# Patient Record
Sex: Male | Born: 1955 | Race: White | Hispanic: No | Marital: Single | Smoking: Former smoker
Health system: Southern US, Community
[De-identification: ages and names within clinical notes are randomized; demographics above are authoritative.]

## PROBLEM LIST (undated history)

## (undated) DIAGNOSIS — R079 Chest pain, unspecified: Secondary | ICD-10-CM

## (undated) HISTORY — DX: Chest pain, unspecified: R07.9

## (undated) HISTORY — PX: TONSILLECTOMY: SUR1361

---

## 1982-10-22 HISTORY — PX: APPENDECTOMY: SHX54

## 2003-09-30 ENCOUNTER — Emergency Department (HOSPITAL_COMMUNITY): Admission: EM | Admit: 2003-09-30 | Discharge: 2003-09-30 | Payer: Self-pay | Admitting: *Deleted

## 2003-11-30 ENCOUNTER — Encounter: Admission: RE | Admit: 2003-11-30 | Discharge: 2003-11-30 | Payer: Self-pay | Admitting: General Surgery

## 2004-03-20 ENCOUNTER — Encounter: Admission: RE | Admit: 2004-03-20 | Discharge: 2004-03-20 | Payer: Self-pay | Admitting: General Surgery

## 2004-04-14 ENCOUNTER — Ambulatory Visit (HOSPITAL_COMMUNITY): Admission: RE | Admit: 2004-04-14 | Discharge: 2004-04-14 | Payer: Self-pay | Admitting: Urology

## 2010-11-12 ENCOUNTER — Encounter: Payer: Self-pay | Admitting: Oncology

## 2012-02-13 ENCOUNTER — Ambulatory Visit: Payer: 59

## 2012-02-13 ENCOUNTER — Ambulatory Visit (INDEPENDENT_AMBULATORY_CARE_PROVIDER_SITE_OTHER): Payer: 59 | Admitting: Family Medicine

## 2012-02-13 VITALS — BP 189/102 | HR 64 | Temp 97.8°F | Resp 16 | Ht 69.0 in | Wt 222.0 lb

## 2012-02-13 DIAGNOSIS — M25469 Effusion, unspecified knee: Secondary | ICD-10-CM

## 2012-02-13 DIAGNOSIS — I10 Essential (primary) hypertension: Secondary | ICD-10-CM

## 2012-02-13 LAB — BASIC METABOLIC PANEL
BUN: 17 mg/dL (ref 6–23)
CO2: 25 mEq/L (ref 19–32)
Calcium: 9.4 mg/dL (ref 8.4–10.5)
Chloride: 109 mEq/L (ref 96–112)
Creat: 1.05 mg/dL (ref 0.50–1.35)
Glucose, Bld: 87 mg/dL (ref 70–99)
Potassium: 4.8 mEq/L (ref 3.5–5.3)
Sodium: 142 mEq/L (ref 135–145)

## 2012-02-13 NOTE — Progress Notes (Signed)
  Patient Name: Samuel Clark Date of Birth: [birth year] Medical Record Number: [id number] Gender: male Date of Encounter: 02/13/2012  History of Present Illness:  Samuel Clark is a 56 y.o. very pleasant male patient who presents with the following:  Here with right knee swelling off and on for about 2 weeks.  No known injury, never had this before.  No clicking, popping or getting stuck, no instability.  The knee hurts and is stiff sometimes but not very severely.  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  He has not had his BP checked in some time Unsure if any FHX of HTN.  He has no HA, CP or other symptoms  There is no problem list on file for this patient.  No past medical history on file. No past surgical history on file. History  Substance Use Topics  . Smoking status: Current Everyday Smoker  . Smokeless tobacco: Not on file  . Alcohol Use: Not on file   No family history on file. No Known Allergies  Medication list has been reviewed and updated.  Review of Systems: As per HPI- otherwise negative.   Physical Examination: Filed Vitals:   02/13/12 1035  BP: 186/92  Pulse: 64  Temp: 97.8 F (36.6 C)  TempSrc: Oral  Resp: 16  Height: 5\' 9"  (1.753 m)  Weight: 222 lb (100.699 kg)    Body mass index is 32.78 kg/(m^2).  GEN: WDWN, NAD, Non-toxic, A & O x 3, obese HEENT: Atraumatic, Normocephalic. Neck supple. No masses, No LAD. Ears and Nose: No external deformity. CV: RRR, No M/G/R. No JVD. No thrill. No extra heart sounds. PULM: CTA B, no wheezes, crackles, rhonchi. No retractions. No resp. distress. No accessory muscle use EXTR: No c/c/e NEURO Normal gait.  PSYCH: Normally interactive. Conversant. Not depressed or anxious appearing.  Calm demeanor.  Right knee:  Tiny joint effusion, no redness or heat, no tenderness with flexion or extension, stable ligaments.   [facility] reading  (PRIMARY) by  Dr. Patsy Lager.  patellofemoral compartment loss.   RIGHT KNEE - COMPLETE 4+ VIEW  Comparison: None.  Findings: Mild patellofemoral joint degenerative changes with slightly high riding patella.  Tiny joint effusion.  Minimal medial tibiofemoral joint space degenerative changes.  IMPRESSION: Mild patellofemoral joint degenerative changes with slightly high riding patella.  Tiny joint effusion.  Minimal medial tibiofemoral joint space degenerative changes.  Assessment and Plan: 1. Swelling of knee joint  DG Knee Complete 4 Views Right  2. Hypertension  Basic metabolic panel   Suspect that [name] does indeed have hypertension.  Check labs as above in anticipation of starting HTN medications.  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 Suspect he had OA of his knee, possibly a small meniscal tear.  Recommend DC the NSAIDs that he has been using and use tylenol as needed due to his HTN. At this time he is not in a hurry to see ortho, but will consider doing this if he is not better soon.  Ace wrap for compression may help as well.

## 2012-02-14 ENCOUNTER — Encounter: Payer: Self-pay | Admitting: Family Medicine

## 2012-02-18 ENCOUNTER — Telehealth: Payer: Self-pay | Admitting: Family Medicine

## 2012-02-18 NOTE — Telephone Encounter (Signed)
Please see copy of BP readings that patient dropped off on 02/18/12 @ 6:43pm (readings are in an inner-office envelope, in your box)

## 2012-02-19 NOTE — Telephone Encounter (Signed)
Please advise on these.

## 2012-02-20 ENCOUNTER — Other Ambulatory Visit: Payer: Self-pay | Admitting: Family Medicine

## 2012-02-20 DIAGNOSIS — I10 Essential (primary) hypertension: Secondary | ICD-10-CM

## 2012-02-20 MED ORDER — LISINOPRIL-HYDROCHLOROTHIAZIDE 10-12.5 MG PO TABS: 1.0000 | ORAL_TABLET | Freq: Every day | ORAL | Status: DC

## 2016-04-30 ENCOUNTER — Telehealth: Payer: Self-pay | Admitting: Internal Medicine

## 2016-04-30 NOTE — Telephone Encounter (Signed)
Received records from [facility] for appointment on 05/31/16 with Dr Rennis GoldenHilty.  [facility] for Dr Blanchie DessertHilty's schedule on 05/31/16. lp

## 2016-05-30 ENCOUNTER — Encounter: Payer: Self-pay | Admitting: Internal Medicine

## 2016-05-30 DIAGNOSIS — R079 Chest pain, unspecified: Secondary | ICD-10-CM | POA: Insufficient documentation

## 2016-05-31 ENCOUNTER — Encounter: Payer: Self-pay | Admitting: Internal Medicine

## 2016-05-31 ENCOUNTER — Encounter (INDEPENDENT_AMBULATORY_CARE_PROVIDER_SITE_OTHER): Payer: Self-pay

## 2016-05-31 ENCOUNTER — Ambulatory Visit (INDEPENDENT_AMBULATORY_CARE_PROVIDER_SITE_OTHER): Payer: 59 | Admitting: Internal Medicine

## 2016-05-31 VITALS — BP 142/90 | HR 71 | Ht 69.0 in | Wt 232.6 lb

## 2016-05-31 DIAGNOSIS — E668 Other obesity: Secondary | ICD-10-CM | POA: Diagnosis not present

## 2016-05-31 DIAGNOSIS — Z87891 Personal history of nicotine dependence: Secondary | ICD-10-CM | POA: Diagnosis not present

## 2016-05-31 DIAGNOSIS — I491 Atrial premature depolarization: Secondary | ICD-10-CM | POA: Diagnosis not present

## 2016-05-31 DIAGNOSIS — R079 Chest pain, unspecified: Secondary | ICD-10-CM

## 2016-05-31 NOTE — Patient Instructions (Signed)
Your physician has requested that you have en exercise stress myoview. For further information please visit [email or web address]. Please follow instruction sheet, as given.  Your physician recommends that you schedule a follow-up appointment as needed with Dr. Rennis GoldenHilty

## 2016-05-31 NOTE — Progress Notes (Signed)
OFFICE NOTE  Chief Complaint:  Chest pain  Primary Care Physician: Lupe Carney, MD  HPI:  Samuel Clark is a 60 y.o. male who has little past medical history. He had not regularly seen a physician until recently after had an episode of chest pain. It seems like he was mowing his lawn probably in late June or early July and had discomfort in his chest. It was a very hot day. He had significant diaphoresis and was not drinking a lot of water. The tightness in his chest improved after he rested however started mowing his lawn again in that came back. Subsequently he stopped doing some lawnmowing and then saw Dr. Clovis Riley. EKG showed no acute changes. He underwent lab work which was unremarkable and cholesterol profile indicated total cholesterol of 197, triglycerides 103 HDL 57 and LDL 120. No evidence of diabetes although there was impaired fasting glucose with a blood sugar of 112. Is not clear if this was a fasting sample. Subsequently he was referred for cardiac evaluation. He has no significant history of coronary disease in his parents or his sister. He does have a child a congenital heart disease and aortic valve replacement at age 40. He is a former smoker about 1 pack per week for 20 years but quit in 2012. He drinks about 10 beers a week, mostly on weekends. 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  PMHx:  Past Medical History:  Diagnosis Date  . Chest pain     Past Surgical History:  Procedure Laterality Date  . APPENDECTOMY  1984  . TONSILLECTOMY      FAMHx:  Family History  Problem Relation Age of Onset  . Hypertension Mother   . Cancer Father   . Healthy Sister     SOCHx:   reports that he quit smoking about 5 years ago. He does not have any smokeless tobacco history on file. He reports that he drinks about 3.0 oz of alcohol per week . His drug history is not on file.  ALLERGIES:  No Known Allergies  ROS: Pertinent items noted  in HPI and remainder of comprehensive ROS otherwise negative.  HOME MEDS: No current outpatient prescriptions on file.   No current facility-administered medications for this visit.     LABS/IMAGING: No results found for this or any previous visit (from the past 48 hour(s)). No results found.  WEIGHTS: Wt Readings from Last 3 Encounters:  05/31/16 232 lb 9.6 oz (105.5 kg)  02/13/12 222 lb (100.7 kg)    VITALS: BP (!) 142/90   Pulse 71   Ht  (1.753 m)   Wt 232 lb 9.6 oz (105.5 kg)   BMI 34.35 kg/m   EXAM: General appearance: alert, no distress and moderately obese Neck: no carotid bruit and no JVD Lungs: clear to auscultation bilaterally Heart: regular rate and rhythm and No murmur, occasional extra beats Abdomen: soft, non-tender; bowel sounds normal; no masses,  no organomegaly Extremities: extremities normal, atraumatic, no cyanosis or edema Pulses: 2+ and symmetric Skin: Skin color, texture, turgor normal. No rashes or lesions Neurologic: Grossly normal Psych: Pleasant  EKG: Normal sinus rhythm at 71 with PACs  ASSESSMENT: 1. Exertional chest pain 2. Former smoker 3. Moderate obesity 4. PACs  PLAN: 1.   Mr. Ervin Knack had an episode of exertional chest pain while mowing his lawn. He has not had any further episodes. This could've been related to dehydration or even heat exhaustion.  He is of an appropriate age for coronary disease and had a smoking history. He is moderately obese which adds to at least an intermediate pretest probability for coronary disease. Based on this I would recommend an exercise Myoview to evaluate for coronary ischemia. He is also noted to have PVCs on his EKG today. He seems to be asymptomatic with this. I'll contact him with the results of the stress test and if it is abnormal more workup will be recommended. Otherwise he can follow-up with his primary care provider.  Takes for the kind referral.  Chrystie NoseKenneth C. Hilty, MD,  [facility] Attending Cardiologist [facility]  Chrystie NoseKenneth C Hilty 05/31/2016, 11:52 AM

## 2016-06-06 ENCOUNTER — Telehealth (HOSPITAL_COMMUNITY): Payer: Self-pay

## 2016-06-06 NOTE — Telephone Encounter (Signed)
Encounter complete. 

## 2016-06-07 ENCOUNTER — Telehealth (HOSPITAL_COMMUNITY): Payer: Self-pay

## 2016-06-07 NOTE — Telephone Encounter (Signed)
Encounter complete. 

## 2016-06-08 ENCOUNTER — Ambulatory Visit (HOSPITAL_COMMUNITY)
Admission: RE | Admit: 2016-06-08 | Discharge: 2016-06-08 | Disposition: A | Discharge status: Home / Self Care | Payer: 59 | Source: Ambulatory Visit | Attending: Cardiovascular Disease | Admitting: Cardiovascular Disease

## 2016-06-08 DIAGNOSIS — E669 Obesity, unspecified: Secondary | ICD-10-CM | POA: Diagnosis not present

## 2016-06-08 DIAGNOSIS — R9439 Abnormal result of other cardiovascular function study: Secondary | ICD-10-CM | POA: Insufficient documentation

## 2016-06-08 DIAGNOSIS — Z87891 Personal history of nicotine dependence: Secondary | ICD-10-CM | POA: Diagnosis not present

## 2016-06-08 DIAGNOSIS — R61 Generalized hyperhidrosis: Secondary | ICD-10-CM | POA: Diagnosis not present

## 2016-06-08 DIAGNOSIS — Z6834 Body mass index (BMI) 34.0-34.9, adult: Secondary | ICD-10-CM | POA: Insufficient documentation

## 2016-06-08 DIAGNOSIS — I491 Atrial premature depolarization: Secondary | ICD-10-CM | POA: Diagnosis not present

## 2016-06-08 DIAGNOSIS — R079 Chest pain, unspecified: Secondary | ICD-10-CM | POA: Diagnosis present

## 2016-06-08 LAB — MYOCARDIAL PERFUSION IMAGING
LV dias vol: 84 mL (ref 62–150)
LV sys vol: 34 mL
Peak HR: 95 {beats}/min
Rest HR: 82 {beats}/min
TID: 1.41

## 2016-06-08 MED ORDER — TECHNETIUM TC 99M TETROFOSMIN IV KIT
30.6000 | PACK | Freq: Once | INTRAVENOUS | Status: AC | PRN
  Administered 2016-06-08: 31 via INTRAVENOUS
  Filled 2016-06-08: qty 31

## 2016-06-08 MED ORDER — REGADENOSON 0.4 MG/5ML IV SOLN
0.4000 mg | Freq: Once | INTRAVENOUS | Status: AC
  Administered 2016-06-08: 0.4 mg via INTRAVENOUS

## 2016-06-08 MED ORDER — TECHNETIUM TC 99M TETROFOSMIN IV KIT
10.7000 | PACK | Freq: Once | INTRAVENOUS | Status: AC | PRN
  Administered 2016-06-08: 11 via INTRAVENOUS
  Filled 2016-06-08: qty 11

## 2016-06-12 ENCOUNTER — Telehealth: Payer: Self-pay | Admitting: Internal Medicine

## 2016-06-12 NOTE — Telephone Encounter (Signed)
Attempted to return call to patient @ number provided - phone message states "[employer]" is not available. Did not leave message. Will attempt to contact later.

## 2016-06-12 NOTE — Telephone Encounter (Signed)
Returning a call from a few minutes ago.

## 2016-06-13 ENCOUNTER — Telehealth: Payer: Self-pay | Admitting: Internal Medicine

## 2016-06-13 NOTE — Telephone Encounter (Signed)
Mr. Samuel Clark is returning your call, can call him at work at [phone]

## 2016-06-13 NOTE — Telephone Encounter (Signed)
Patient called with stress test results and appointment made to follow up with MD.

## 2016-06-19 ENCOUNTER — Encounter: Payer: Self-pay | Admitting: Internal Medicine

## 2016-06-19 ENCOUNTER — Ambulatory Visit (INDEPENDENT_AMBULATORY_CARE_PROVIDER_SITE_OTHER): Payer: 59 | Admitting: Internal Medicine

## 2016-06-19 VITALS — BP 154/88 | HR 66 | Ht 70.0 in | Wt 237.0 lb

## 2016-06-19 DIAGNOSIS — I10 Essential (primary) hypertension: Secondary | ICD-10-CM | POA: Diagnosis not present

## 2016-06-19 DIAGNOSIS — R9439 Abnormal result of other cardiovascular function study: Secondary | ICD-10-CM | POA: Diagnosis not present

## 2016-06-19 DIAGNOSIS — E785 Hyperlipidemia, unspecified: Secondary | ICD-10-CM | POA: Diagnosis not present

## 2016-06-19 DIAGNOSIS — Z79899 Other long term (current) drug therapy: Secondary | ICD-10-CM

## 2016-06-19 DIAGNOSIS — I491 Atrial premature depolarization: Secondary | ICD-10-CM

## 2016-06-19 MED ORDER — ATORVASTATIN CALCIUM 20 MG PO TABS: 20.0000 mg | ORAL_TABLET | Freq: Every day | ORAL | 5 refills | Status: AC

## 2016-06-19 MED ORDER — METOPROLOL SUCCINATE ER 25 MG PO TB24: 12.5000 mg | ORAL_TABLET | Freq: Every day | ORAL | 5 refills | Status: AC

## 2016-06-19 NOTE — Patient Instructions (Addendum)
Your physician has recommended you make the following change in your medication: 1. START aspirin 81mg  once daily 2. START metoprolol succinate (Toprol XL) 12.5mg  once daily 3. START atorvastatin 20mg  once daily (for cholesterol)  Your physician recommends that you return for lab work FASTING (nothing to eat/drink after midnight) in 3 months.   Your physician recommends that you schedule a follow-up appointment in: THREE MONTHS (after labs)

## 2016-06-19 NOTE — Progress Notes (Signed)
OFFICE NOTE  Chief Complaint:  Follow-up stress test  Primary Care Physician: Lupe Carneyean Mitchell, MD  HPI:  Samuel Clark is a 60 y.o. male who has little past medical history. He had not regularly seen a physician until recently after had an episode of chest pain. It seems like he was mowing his lawn probably in late June or early July and had discomfort in his chest. It was a very hot day. He had significant diaphoresis and was not drinking a lot of water. The tightness in his chest improved after he rested however started mowing his lawn again in that came back. Subsequently he stopped doing some lawnmowing and then saw Dr. Clovis RileyMitchell. EKG showed no acute changes. He underwent lab work which was unremarkable and cholesterol profile indicated total cholesterol of 197, triglycerides 103 HDL 57 and LDL 120. No evidence of diabetes although there was impaired fasting glucose with a blood sugar of 112. Is not clear if this was a fasting sample. Subsequently he was referred for cardiac evaluation. He has no significant history of coronary disease in his parents or his sister. He does have a child a congenital heart disease and aortic valve replacement at age 61. He is a former smoker about 1 pack per week for 20 years but quit in 2012. He drinks about 10 beers a week, mostly on weekends. 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  06/19/2016  Samuel Clark returns today for follow-up of his stress test. Although this was interpreted as a low risk stress test, there was a small area of reversible ischemia in the inferior wall. Since he had stress test however he feels well. He's had no further chest pain. He has been active. Of note he is not currently on any medical therapy. We discussed possible management options including the COURAGE trial results which indicated that medical therapy low risk but abnormal stress testing was comparable toward early invasive management. Given  the fact that he is not on medical therapy, I would advocate a trial of medical therapy before considering catheterization, especially since he is asymptomatic.  PMHx:  Past Medical History:  Diagnosis Date  . Chest pain     Past Surgical History:  Procedure Laterality Date  . APPENDECTOMY  1984  . TONSILLECTOMY      FAMHx:  Family History  Problem Relation Age of Onset  . Hypertension Mother   . Cancer Father   . Healthy Sister   . Valvular heart disease Child     mechanical aortic valve replacement @ age 60    SOCHx:   reports that he quit smoking about 5 years ago. He has a 20.00 pack-year smoking history. He has never used smokeless tobacco. He reports that he drinks about 6.0 oz of alcohol per week . His drug history is not on file.  ALLERGIES:  No Known Allergies  ROS: Pertinent items noted in HPI and remainder of comprehensive ROS otherwise negative.  HOME MEDS: Current Outpatient Prescriptions  Medication Sig Dispense Refill  . aspirin EC 81 MG tablet Take 81 mg by mouth daily.     No current facility-administered medications for this visit.     LABS/IMAGING: No results found for this or any previous visit (from the past 48 hour(s)). No results found.  WEIGHTS: Wt Readings from Last 3 Encounters:  06/19/16 237 lb (107.5 kg)  06/08/16 232 lb (105.2 kg)  05/31/16 232 lb 9.6 oz (105.5 kg)  VITALS: BP (!) 154/88   Pulse 66   Ht 5\' 10"  (1.778 m)   Wt 237 lb (107.5 kg)   BMI 34.01 kg/m   EXAM: Deferred  EKG: Deferred  ASSESSMENT: 1. Exertional chest pain - low risk stress test with mild reversible inferior ischemia (05/2016) 2. Former smoker 3. Moderate obesity 4. PACs  PLAN: 1.   Mr. Ervin Knack had a mildly abnormal nuclear stress test which was low risk demonstrating an inferior reversible perfusion defect. He's not had any further chest pain. Blood pressure remains elevated. I like to start him on low-dose Toprol XL 12.5 mg daily. He should  take daily 81 mg aspirin. Also, based on his recent lipid profile and the fact that he likely has coronary disease, I like to maximize medical therapy by placing him on low-dose statin therapy. We'll start Lipitor 20 mg daily. Plan to recheck a lipid profile metabolic profile in 3 months and I'll see him back at that time. Should he have any further anginal symptoms, despite medical therapy, I would have a low threshold for cardiac catheterization.  Thanks again for allowing me to participate in his care.  Chrystie Nose, MD, [facility] Attending Cardiologist [facility]  Chrystie Nose 06/19/2016, 9:35 AM

## 2016-09-25 ENCOUNTER — Ambulatory Visit: Payer: 59 | Admitting: Internal Medicine

## 2016-10-29 ENCOUNTER — Ambulatory Visit: Payer: 59 | Admitting: Internal Medicine

## 2017-07-04 DIAGNOSIS — Z0000 Encounter for general adult medical examination without abnormal findings: Secondary | ICD-10-CM | POA: Diagnosis not present

## 2017-07-04 DIAGNOSIS — Z125 Encounter for screening for malignant neoplasm of prostate: Secondary | ICD-10-CM | POA: Diagnosis not present

## 2017-07-04 DIAGNOSIS — E7800 Pure hypercholesterolemia, unspecified: Secondary | ICD-10-CM | POA: Diagnosis not present

## 2018-01-06 DIAGNOSIS — Z1159 Encounter for screening for other viral diseases: Secondary | ICD-10-CM | POA: Diagnosis not present

## 2018-01-06 DIAGNOSIS — I10 Essential (primary) hypertension: Secondary | ICD-10-CM | POA: Diagnosis not present

## 2018-01-06 DIAGNOSIS — E7800 Pure hypercholesterolemia, unspecified: Secondary | ICD-10-CM | POA: Diagnosis not present

## 2018-06-23 IMAGING — NM NM MISC PROCEDURE
6 series · 36 of 36 positions shown · non-contrast
Comparison: none

[Series 1: wbr rest · 6.40mm/px · 6 of 64 frames shown]
[frame 6/64]
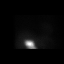
[frame 16/64]
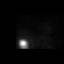
[frame 27/64]
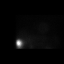
[frame 38/64]
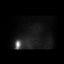
[frame 48/64]
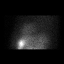
[frame 59/64]
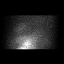

[Series 1: wbr_r-proj_st wbr rest · 6.40mm/px · 6 of 64 frames shown]
[frame 6/64]
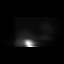
[frame 16/64]
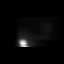
[frame 27/64]
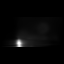
[frame 38/64]
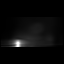
[frame 48/64]
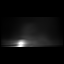
[frame 59/64]
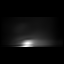

[Series 2: wbr_s-proj_st wbr stress-gsp · 6.40mm/px · 6 of 512 frames shown]
[frame 43/512]
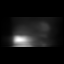
[frame 128/512]
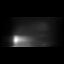
[frame 214/512]
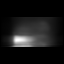
[frame 299/512]
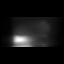
[frame 384/512]
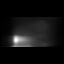
[frame 470/512]
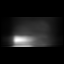

[Series 2: wbr stress-gsp · 6.40mm/px · 6 of 512 frames shown]
[frame 43/512]
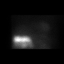
[frame 128/512]
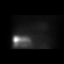
[frame 214/512]
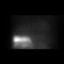
[frame 299/512]
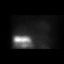
[frame 384/512]
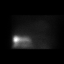
[frame 470/512]
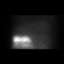

[Series 3: wbr_s-proj_st wbr stress-sum-em · 6.40mm/px · 6 of 64 frames shown]
[frame 6/64]
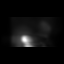
[frame 16/64]
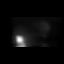
[frame 27/64]
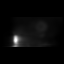
[frame 38/64]
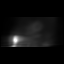
[frame 48/64]
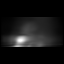
[frame 59/64]
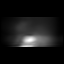

[Series 3: wbr stress-sum-em · 6.40mm/px · 6 of 64 frames shown]
[frame 6/64]
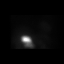
[frame 16/64]
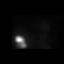
[frame 27/64]
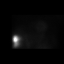
[frame 38/64]
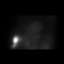
[frame 48/64]
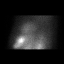
[frame 59/64]
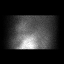

[36 of 36 positions shown; findings below may reference images not displayed]

Canned report from images found in remote index.

Refer to host system for actual result text.
